# Patient Record
Sex: Female | Born: 1965 | Race: White | Hispanic: No | Marital: Married | Smoking: Never smoker
Health system: Southern US, Community
[De-identification: ages and names within clinical notes are randomized; demographics above are authoritative.]

## PROBLEM LIST (undated history)

## (undated) DIAGNOSIS — K219 Gastro-esophageal reflux disease without esophagitis: Secondary | ICD-10-CM

## (undated) DIAGNOSIS — M797 Fibromyalgia: Secondary | ICD-10-CM

## (undated) DIAGNOSIS — Z972 Presence of dental prosthetic device (complete) (partial): Secondary | ICD-10-CM

## (undated) DIAGNOSIS — J45909 Unspecified asthma, uncomplicated: Secondary | ICD-10-CM

## (undated) DIAGNOSIS — R42 Dizziness and giddiness: Secondary | ICD-10-CM

## (undated) DIAGNOSIS — M1990 Unspecified osteoarthritis, unspecified site: Secondary | ICD-10-CM

## (undated) DIAGNOSIS — E785 Hyperlipidemia, unspecified: Secondary | ICD-10-CM

## (undated) DIAGNOSIS — I10 Essential (primary) hypertension: Secondary | ICD-10-CM

## (undated) DIAGNOSIS — E119 Type 2 diabetes mellitus without complications: Secondary | ICD-10-CM

## (undated) HISTORY — PX: BREAST BIOPSY: SHX20

## (undated) HISTORY — PX: ABDOMINAL HYSTERECTOMY: SHX81

## (undated) HISTORY — PX: NASAL SINUS SURGERY: SHX719

## (undated) HISTORY — PX: THYROID CYST EXCISION: SHX2511

## (undated) HISTORY — PX: GALLBLADDER SURGERY: SHX652

## (undated) HISTORY — PX: PARATHYROID EXPLORATION: SHX732

---

## 2018-02-05 ENCOUNTER — Ambulatory Visit
Admission: EM | Admit: 2018-02-05 | Discharge: 2018-02-05 | Disposition: A | Discharge status: Home / Self Care | Payer: Managed Care, Other (non HMO) | Attending: Emergency Medicine | Admitting: Emergency Medicine

## 2018-02-05 ENCOUNTER — Other Ambulatory Visit: Payer: Self-pay

## 2018-02-05 ENCOUNTER — Encounter: Payer: Self-pay | Admitting: Emergency Medicine

## 2018-02-05 DIAGNOSIS — H6501 Acute serous otitis media, right ear: Secondary | ICD-10-CM | POA: Diagnosis not present

## 2018-02-05 HISTORY — DX: Essential (primary) hypertension: I10

## 2018-02-05 HISTORY — DX: Unspecified asthma, uncomplicated: J45.909

## 2018-02-05 HISTORY — DX: Unspecified osteoarthritis, unspecified site: M19.90

## 2018-02-05 HISTORY — DX: Type 2 diabetes mellitus without complications: E11.9

## 2018-02-05 HISTORY — DX: Hyperlipidemia, unspecified: E78.5

## 2018-02-05 MED ORDER — AMOXICILLIN-POT CLAVULANATE 875-125 MG PO TABS: 1.0000 | ORAL_TABLET | Freq: Two times a day (BID) | ORAL | 0 refills | Status: AC

## 2018-02-05 MED ORDER — IBUPROFEN 600 MG PO TABS: 600.0000 mg | ORAL_TABLET | Freq: Four times a day (QID) | ORAL | 0 refills | Status: DC | PRN

## 2018-02-05 NOTE — ED Triage Notes (Signed)
Cough, congested, right ear pain and facial pressure since yesterday

## 2018-02-05 NOTE — ED Provider Notes (Signed)
HPI  SUBJECTIVE:  Carolyn Holden is a 52 y.o. female who presents with decreased hearing, right ear pain and fullness starting today.  Also reports right maxillary sinus pain.  Thinks that this is radiating from her ear.  She reports nasal congestion, rhinorrhea, postnasal drip, sneezing, itchy, watery eyes, cough at night secondary to the postnasal drip for the past several weeks.  She also reports intermittent positional vertigo.  She reports some tinnitus this morning but states that this is since resolved.  No chest pain, wheezing, shortness of breath.  She has been taking Claritin, Benadryl, Flonase, ibuprofen 400 mg.  Symptoms are better with an ice pack, and the ibuprofen.  Her vertigo is worse with certain positions, particularly leaning forward, turning over with lying down and better when she stops the offending movement.  She states that she gets ear infections once or twice a year when her allergies get bad and she reports that she gets vertigo with these ear infections.  States this vertigo is identical to previous ear infections.  She states meclizine does not work for her.  She has a history of hypertension, asthma, diabetes, hypercholesterolemia, psoriatic arthritis on Enbrel.  LMP: 2009.  She is status post hysterectomy.  GYF:[id number], Jefm Bryant   Past Medical History:  Diagnosis Date  . Arthritis   . Asthma   . Diabetes mellitus without complication ([place])   . Hyperlipidemia   . Hypertension     Past Surgical History:  Procedure Laterality Date  . ABDOMINAL HYSTERECTOMY    . GALLBLADDER SURGERY    . NASAL SINUS SURGERY    . PARATHYROID EXPLORATION    . THYROID CYST EXCISION      No family history on file.  Social History   Tobacco Use  . Smoking status: Never Smoker  . Smokeless tobacco: Never Used  Substance Use Topics  . Alcohol use: Never    Frequency: Never  . Drug use: Never    No current facility-administered medications for this encounter.   Current  Outpatient Medications:  .  albuterol (PROVENTIL HFA) 108 (90 Base) MCG/ACT inhaler, Inhale into the lungs., Disp: , Rfl:  .  atorvastatin (LIPITOR) 10 MG tablet, Take by mouth., Disp: , Rfl:  .  Blood Glucose Monitoring Suppl (ONETOUCH VERIO) w/Device KIT, 1 each by XX route as directed, Disp: , Rfl:  .  calcipotriene-betamethasone (TACLONEX) external suspension, One application daily for up to 8 weeks., Disp: , Rfl:  .  clonazePAM (KLONOPIN) 0.5 MG tablet, Take 0.5 mg by mouth daily., Disp: , Rfl: 5 .  diphenhydrAMINE (BENADRYL) 25 mg capsule, Take by mouth., Disp: , Rfl:  .  enalapril (VASOTEC) 2.5 MG tablet, Take by mouth., Disp: , Rfl:  .  ENBREL SURECLICK 50 MG/ML injection, , Disp: , Rfl:  .  estradiol (MINIVELLE) 0.0375 MG/24HR, Place onto the skin., Disp: , Rfl:  .  fluticasone (VERAMYST) 27.5 MCG/SPRAY nasal spray, Place into the nose., Disp: , Rfl:  .  glucose blood (PRECISION QID TEST) test strip, Use once daily Rotate testing throughout the day-pre meal, 2 hour after meal, bedtime., Disp: , Rfl:  .  loratadine (CLARITIN) 10 MG tablet, Take 10 mg by mouth daily., Disp: , Rfl: 4 .  metFORMIN (GLUCOPHAGE-XR) 500 MG 24 hr tablet, Take by mouth., Disp: , Rfl:  .  zolpidem (AMBIEN) 10 MG tablet, Take by mouth., Disp: , Rfl:  .  amoxicillin-clavulanate (AUGMENTIN) 875-125 MG tablet, Take 1 tablet by mouth 2 (two) times daily for 7  days., Disp: 14 tablet, Rfl: 0 .  ibuprofen (ADVIL,MOTRIN) 600 MG tablet, Take 1 tablet (600 mg total) by mouth every 6 (six) hours as needed., Disp: 30 tablet, Rfl: 0  Not on File   ROS  As noted in HPI.   Physical Exam  BP (!) 145/90 (BP Location: Right Arm)   Pulse 74   Temp 98.3 F (36.8 C) (Oral)   Resp 16   Ht 5' 4.5" (1.638 m)   Wt 210 lb (95.3 kg)   SpO2 98%   BMI 35.49 kg/m   Constitutional: Well developed, well nourished, no acute distress Eyes:  EOMI, conjunctiva normal bilaterally HENT: Normocephalic, atraumatic,mucus membranes  moist.  Right ear: External ear normal.  External ear canal normal.  No pain with traction on pinna or palpation of tragus.  No tenderness over the mastoid.  Right TM dull, with fluid behind it.  No erythema or bulging.  Left TM normal.  No tenderness or crepitus over the TMJ bilaterally.  Positive purulent nasal congestion.  Positive right maxillary sinus tenderness.  Positive cobblestoning, no postnasal drip. Neck: No cervical lymphadenopathy Respiratory: Normal inspiratory effort, lungs clear bilaterally Cardiovascular: Normal rate GI: nondistended skin: No rash, skin intact Musculoskeletal: no deformities Neurologic: Alert & oriented x 3, no focal neuro deficits Psychiatric: Speech and behavior appropriate   ED Course   Medications - No data to display  No orders of the defined types were placed in this encounter.   No results found for this or any previous visit (from the past 24 hour(s)). No results found.  ED Clinical Impression  Right acute serous otitis media, recurrence not specified   ED Assessment/Plan  Patient with a serous otitis.  She states that the vertigo that she has experienced for the past several weeks is identical to whenever she gets ear infections which happen once or twice a year.  She denies any strokelike symptoms.  Has no neuro deficits.  Doubt Mnire's.  She also has some sinus tenderness which is concerning for sinusitis.  Will send home with Augmentin for 7 days.  Also ibuprofen 600 mg to take with 1 g of Tylenol 3-4 times a day as needed for pain.  Follow-up with her primary care physician as needed.   Discussed MDM, treatment plan, and plan for follow-up with patient. Discussed sn/sx that should prompt return to the ED. patient agrees with plan.   Meds ordered this encounter  Medications  . amoxicillin-clavulanate (AUGMENTIN) 875-125 MG tablet    Sig: Take 1 tablet by mouth 2 (two) times daily for 7 days.    Dispense:  14 tablet    Refill:  0   . ibuprofen (ADVIL,MOTRIN) 600 MG tablet    Sig: Take 1 tablet (600 mg total) by mouth every 6 (six) hours as needed.    Dispense:  30 tablet    Refill:  0    *This clinic note was created using [employer]. Therefore, there may be occasional mistakes despite careful proofreading.   ?   Melynda Ripple, MD 02/05/18 1642

## 2018-02-05 NOTE — Discharge Instructions (Addendum)
Continue the Benadryl, Claritin, Flonase.  600 mg of ibuprofen with 1 g of Tylenol 3 or 4 times a day as we discussed.  Try some saline nasal irrigation with a Lloyd HugerNeil med rinse and distilled water.  The antibiotics, even if you feel better.

## 2019-09-07 ENCOUNTER — Emergency Department
Admission: EM | Admit: 2019-09-07 | Discharge: 2019-09-07 | Disposition: A | Discharge status: Home / Self Care | Payer: Managed Care, Other (non HMO) | Attending: Emergency Medicine | Admitting: Emergency Medicine

## 2019-09-07 ENCOUNTER — Emergency Department: Payer: Managed Care, Other (non HMO)

## 2019-09-07 ENCOUNTER — Encounter: Payer: Self-pay | Admitting: Medical Oncology

## 2019-09-07 ENCOUNTER — Other Ambulatory Visit: Payer: Self-pay

## 2019-09-07 DIAGNOSIS — E119 Type 2 diabetes mellitus without complications: Secondary | ICD-10-CM | POA: Diagnosis not present

## 2019-09-07 DIAGNOSIS — Z7984 Long term (current) use of oral hypoglycemic drugs: Secondary | ICD-10-CM | POA: Diagnosis not present

## 2019-09-07 DIAGNOSIS — R1012 Left upper quadrant pain: Secondary | ICD-10-CM

## 2019-09-07 DIAGNOSIS — I10 Essential (primary) hypertension: Secondary | ICD-10-CM | POA: Insufficient documentation

## 2019-09-07 DIAGNOSIS — Z79899 Other long term (current) drug therapy: Secondary | ICD-10-CM | POA: Insufficient documentation

## 2019-09-07 DIAGNOSIS — K219 Gastro-esophageal reflux disease without esophagitis: Secondary | ICD-10-CM | POA: Insufficient documentation

## 2019-09-07 DIAGNOSIS — J45909 Unspecified asthma, uncomplicated: Secondary | ICD-10-CM | POA: Diagnosis not present

## 2019-09-07 LAB — BASIC METABOLIC PANEL
Anion gap: 10 (ref 5–15)
BUN: 16 mg/dL (ref 6–20)
CO2: 25 mmol/L (ref 22–32)
Calcium: 9.6 mg/dL (ref 8.9–10.3)
Chloride: 105 mmol/L (ref 98–111)
Creatinine, Ser: 1.03 mg/dL — ABNORMAL HIGH (ref 0.44–1.00)
GFR calc Af Amer: >60 mL/min (ref >60)
GFR calc non Af Amer: >60 mL/min (ref >60)
Glucose, Bld: 150 mg/dL — ABNORMAL HIGH (ref 70–99)
Potassium: 3.5 mmol/L (ref 3.5–5.1)
Sodium: 140 mmol/L (ref 135–145)

## 2019-09-07 LAB — CBC
HCT: 45.6 % (ref 36.0–46.0)
Hemoglobin: 15.4 g/dL — ABNORMAL HIGH (ref 12.0–15.0)
MCH: 30 pg (ref 26.0–34.0)
MCHC: 33.8 g/dL (ref 30.0–36.0)
MCV: 88.9 fL (ref 80.0–100.0)
Platelets: 256 10*3/uL (ref 150–400)
RBC: 5.13 MIL/uL — ABNORMAL HIGH (ref 3.87–5.11)
RDW: 12.7 % (ref 11.5–15.5)
WBC: 7.4 10*3/uL (ref 4.0–10.5)
nRBC: 0 % (ref 0.0–0.2)

## 2019-09-07 LAB — TROPONIN I (HIGH SENSITIVITY)
Troponin I (High Sensitivity): <2 ng/L (ref <18)
Troponin I (High Sensitivity): <2 ng/L (ref <18)

## 2019-09-07 MED ORDER — ALUMINUM-MAGNESIUM-SIMETHICONE 200-200-20 MG/5ML PO SUSP: 30.0000 mL | Freq: Three times a day (TID) | ORAL | 0 refills | Status: AC

## 2019-09-07 MED ORDER — FAMOTIDINE 20 MG PO TABS: 20.0000 mg | ORAL_TABLET | Freq: Two times a day (BID) | ORAL | 0 refills | Status: AC

## 2019-09-07 MED ORDER — SODIUM CHLORIDE 0.9% FLUSH: 3.0000 mL | Freq: Once | INTRAVENOUS | Status: DC

## 2019-09-07 NOTE — ED Triage Notes (Signed)
Pt reports she began yesterday having jaw pain, pain went away, this am pain began again and was substernal and down her arm. Pt denies sob.

## 2019-09-07 NOTE — ED Provider Notes (Signed)
[facility] Emergency Department Provider Note  ____________________________________________  Time seen: Approximately 11:50 AM  I have reviewed the triage vital signs and the nursing notes.   HISTORY  Chief Complaint Chest Pain, Arm Pain, and Jaw Pain    HPI Carolyn Holden is a 54 y.o. female with a history of diabetes hypertension hyperlipidemia who comes the ED complaining of left upper quadrant abdominal pain that was also referred to her left posterior shoulder.  Denies chest pain or shortness of breath.  No vomiting or diaphoresis.  Was present when she woke up this morning.  Feels like heartburn that she has had in the past but was more pronounced than usual.  She has taken omeprazole in the past but usually does not take it and instead just uses Alka-Seltzer or Tums as needed.  Symptoms are currently resolved as they are alleviated sitting upright.      Past Medical History:  Diagnosis Date  . Arthritis   . Asthma   . Diabetes mellitus without complication ([place])   . Hyperlipidemia   . Hypertension      There are no problems to display for this patient.    Past Surgical History:  Procedure Laterality Date  . ABDOMINAL HYSTERECTOMY    . GALLBLADDER SURGERY    . NASAL SINUS SURGERY    . PARATHYROID EXPLORATION    . THYROID CYST EXCISION       Prior to Admission medications   Medication Sig Start Date End Date Taking? Authorizing Provider  albuterol (PROVENTIL HFA) 108 (90 Base) MCG/ACT inhaler Inhale into the lungs. 02/16/17   [provider]  aluminum-magnesium hydroxide-simethicone (MAALOX) 329-924-26 MG/5ML SUSP Take 30 mLs by mouth 4 (four) times daily -  before meals and at bedtime. 09/07/19   Carrie Mew, MD  atorvastatin (LIPITOR) 10 MG tablet Take by mouth. 01/18/18 04/18/18  [provider]  Blood Glucose Monitoring Suppl (ONETOUCH VERIO) w/Device KIT 1 each by XX route as directed 01/15/18   [provider]  calcipotriene-betamethasone [facility]) external suspension One application daily for up to 8 weeks. 05/04/15   [provider]  clonazePAM (KLONOPIN) 0.5 MG tablet Take 0.5 mg by mouth daily. 01/19/18   [provider]  diphenhydrAMINE (BENADRYL) 25 mg capsule Take by mouth. 01/18/18   [provider]  enalapril (VASOTEC) 2.5 MG tablet Take by mouth. 02/16/17   [provider]  ENBREL SURECLICK 50 MG/ML injection  01/18/18   [provider]  estradiol (MINIVELLE) 0.0375 MG/24HR Place onto the skin. 10/15/17   [provider]  famotidine (PEPCID) 20 MG tablet Take 1 tablet (20 mg total) by mouth 2 (two) times daily. 09/07/19   Carrie Mew, MD  fluticasone (VERAMYST) 27.5 MCG/SPRAY nasal spray Place into the nose. 01/18/18 01/18/19  [provider]  ibuprofen (ADVIL,MOTRIN) 600 MG tablet Take 1 tablet (600 mg total) by mouth every 6 (six) hours as needed. 02/05/18   Melynda Ripple, MD  loratadine (CLARITIN) 10 MG tablet Take 10 mg by mouth daily. 01/18/18   [provider]  metFORMIN (GLUCOPHAGE-XR) 500 MG 24 hr tablet Take by mouth. 01/18/18   [provider]  zolpidem (AMBIEN) 10 MG tablet Take by mouth. 09/28/17   [provider]     Allergies Dilaudid [hydromorphone] and Levaquin [levofloxacin]   No family history on file.  Social History Social History   Tobacco Use  . Smoking status: Never Smoker  . Smokeless tobacco: Never Used  Substance Use  Topics  . Alcohol use: Never  . Drug use: Never    Review of Systems  Constitutional:   No fever or chills.  ENT:   No sore throat. No rhinorrhea. Cardiovascular:   No chest pain or syncope. Respiratory:   No dyspnea or cough. Gastrointestinal:   Positive as above for upper abdominal pain.  No vomiting or diarrhea. Musculoskeletal:   Negative for focal pain or swelling All other systems reviewed and are negative except as documented above  in ROS and HPI.  ____________________________________________   PHYSICAL EXAM:  VITAL SIGNS: ED Triage Vitals  Enc Vitals Group     BP 09/07/19 0720 130/90     Pulse Rate 09/07/19 0720 85     Resp 09/07/19 0720 18     Temp 09/07/19 0720 98.6 F (37 C)     Temp Source 09/07/19 0720 Oral     SpO2 09/07/19 0720 97 %     Weight 09/07/19 0713 200 lb (90.7 kg)     Height 09/07/19 0713 5' 4" (1.626 m)     Head Circumference --      Peak Flow --      Pain Score 09/07/19 0712 8     Pain Loc --      Pain Edu? --      Excl. in [place]? --     Vital signs reviewed, nursing assessments reviewed.   Constitutional:   Alert and oriented. Non-toxic appearance. Eyes:   Conjunctivae are normal. EOMI. PERRL. ENT      Head:   Normocephalic and atraumatic.      Nose:   Wearing a mask.      Mouth/Throat:   Wearing a mask.      Neck:   No meningismus. Full ROM. Hematological/Lymphatic/Immunilogical:   No cervical lymphadenopathy. Cardiovascular:   RRR. Symmetric bilateral radial and DP pulses.  No murmurs. Cap refill less than 2 seconds. Respiratory:   Normal respiratory effort without tachypnea/retractions. Breath sounds are clear and equal bilaterally. No wheezes/rales/rhonchi. Gastrointestinal:   Soft and nontender. Non distended. There is no CVA tenderness.  No rebound, rigidity, or guarding. Musculoskeletal:   Normal range of motion in all extremities. No joint effusions.  No lower extremity tenderness.  No edema. Neurologic:   Normal speech and language.  Motor grossly intact. No acute focal neurologic deficits are appreciated.  Skin:    Skin is warm, dry and intact. No rash noted.  No petechiae, purpura, or bullae.  ____________________________________________    LABS (pertinent positives/negatives) (all labs ordered are listed, but only abnormal results are displayed) Labs Reviewed  BASIC METABOLIC PANEL - Abnormal; Notable for the following components:      Result Value   Glucose,  Bld 150 (*)    Creatinine, Ser 1.03 (*)    All other components within normal limits  CBC - Abnormal; Notable for the following components:   RBC 5.13 (*)    Hemoglobin 15.4 (*)    All other components within normal limits  TROPONIN I (HIGH SENSITIVITY)  TROPONIN I (HIGH SENSITIVITY)   ____________________________________________   EKG  Interpreted by me Normal sinus rhythm rate of 90.  Normal axis and intervals.  Poor R wave progression.  Normal ST segments and T waves.  No acute ischemic changes.  ____________________________________________    RADIOLOGY  DG Chest 2 View  Result Date: 09/07/2019 CLINICAL DATA:  Substernal chest pain. EXAM: CHEST - 2 VIEW COMPARISON:  None. FINDINGS: The heart size and mediastinal contours are  within normal limits. Both lungs are clear. The visualized skeletal structures are unremarkable. IMPRESSION: No active cardiopulmonary disease. Electronically Signed   By: Titus Dubin M.D.   On: 09/07/2019 08:10    ____________________________________________   PROCEDURES Procedures  ____________________________________________    CLINICAL IMPRESSION / ASSESSMENT AND PLAN / ED COURSE  Medications ordered in the ED: Medications  sodium chloride flush (NS) 0.9 % injection 3 mL (has no administration in time range)    Pertinent labs & imaging results that were available during my care of the patient were reviewed by me and considered in my medical decision making (see chart for details).  Carolyn Holden was evaluated in Emergency Department on 09/07/2019 for the symptoms described in the history of present illness. She was evaluated in the context of the global COVID-19 pandemic, which necessitated consideration that the patient might be at risk for infection with the SARS-CoV-2 virus that causes COVID-19. Institutional protocols and algorithms that pertain to the evaluation of patients at risk for COVID-19 are in a state of rapid change based  on information released by regulatory bodies including the CDC and federal and state organizations. These policies and algorithms were followed during the patient's care in the ED.   Patient presents with upper abdominal pain consistent with GERD.Considering the patient's symptoms, medical history, and physical examination today, I have low suspicion for ACS, PE, TAD, pneumothorax, carditis, mediastinitis, pneumonia, CHF, or sepsis.  I doubt pancreatitis, bowel obstruction, AAA, dissection, biliary disease.  I will treat her with antacids.  EKG chest x-ray and high-sensitivity troponin are all normal which in this case does not require repeating.      ____________________________________________   FINAL CLINICAL IMPRESSION(S) / ED DIAGNOSES    Final diagnoses:  LUQ pain  Gastroesophageal reflux disease, unspecified whether esophagitis present     ED Discharge Orders         Ordered    aluminum-magnesium hydroxide-simethicone (MAALOX) 200-200-20 MG/5ML SUSP  3 times daily before meals & bedtime     09/07/19 1142    famotidine (PEPCID) 20 MG tablet  2 times daily     09/07/19 1142          Portions of this note were generated with dragon dictation software. Dictation errors may occur despite best attempts at proofreading.   Carrie Mew, MD 09/07/19 1153

## 2019-09-09 ENCOUNTER — Other Ambulatory Visit: Payer: Self-pay | Admitting: Gerontology

## 2019-09-09 DIAGNOSIS — R1013 Epigastric pain: Secondary | ICD-10-CM

## 2019-09-09 DIAGNOSIS — M797 Fibromyalgia: Secondary | ICD-10-CM

## 2019-09-29 ENCOUNTER — Ambulatory Visit: Payer: Managed Care, Other (non HMO)

## 2019-10-04 ENCOUNTER — Ambulatory Visit
Admission: RE | Admit: 2019-10-04 | Discharge: 2019-10-04 | Disposition: A | Discharge status: Home / Self Care | Payer: Managed Care, Other (non HMO) | Source: Ambulatory Visit | Attending: Gerontology | Admitting: Gerontology

## 2019-10-04 ENCOUNTER — Other Ambulatory Visit: Payer: Self-pay

## 2019-10-04 DIAGNOSIS — M797 Fibromyalgia: Secondary | ICD-10-CM | POA: Insufficient documentation

## 2019-10-04 DIAGNOSIS — R1013 Epigastric pain: Secondary | ICD-10-CM

## 2019-10-10 ENCOUNTER — Other Ambulatory Visit: Payer: Self-pay | Admitting: Gastroenterology

## 2019-10-10 DIAGNOSIS — R1012 Left upper quadrant pain: Secondary | ICD-10-CM

## 2019-10-10 DIAGNOSIS — R1013 Epigastric pain: Secondary | ICD-10-CM

## 2019-10-14 ENCOUNTER — Ambulatory Visit: Admission: RE | Admit: 2019-10-14 | Payer: Managed Care, Other (non HMO) | Source: Ambulatory Visit

## 2019-10-17 ENCOUNTER — Other Ambulatory Visit: Payer: Self-pay | Admitting: Obstetrics and Gynecology

## 2019-10-17 DIAGNOSIS — Z1231 Encounter for screening mammogram for malignant neoplasm of breast: Secondary | ICD-10-CM

## 2019-10-19 ENCOUNTER — Ambulatory Visit
Admission: RE | Admit: 2019-10-19 | Discharge: 2019-10-19 | Disposition: A | Discharge status: Home / Self Care | Payer: Managed Care, Other (non HMO) | Source: Ambulatory Visit | Attending: Gastroenterology | Admitting: Gastroenterology

## 2019-10-19 ENCOUNTER — Other Ambulatory Visit: Payer: Self-pay

## 2019-10-19 DIAGNOSIS — R1012 Left upper quadrant pain: Secondary | ICD-10-CM | POA: Diagnosis not present

## 2019-10-19 DIAGNOSIS — R1013 Epigastric pain: Secondary | ICD-10-CM | POA: Diagnosis present

## 2019-10-19 MED ORDER — IOHEXOL 300 MG/ML  SOLN
100.0000 mL | Freq: Once | INTRAMUSCULAR | Status: AC | PRN
  Administered 2019-10-19: 100 mL via INTRAVENOUS

## 2019-10-20 ENCOUNTER — Inpatient Hospital Stay: Admission: RE | Admit: 2019-10-20 | Payer: Managed Care, Other (non HMO) | Source: Ambulatory Visit

## 2019-10-24 ENCOUNTER — Ambulatory Visit: Payer: Managed Care, Other (non HMO)

## 2019-10-25 ENCOUNTER — Ambulatory Visit: Payer: Managed Care, Other (non HMO)

## 2019-11-15 ENCOUNTER — Inpatient Hospital Stay: Admission: RE | Admit: 2019-11-15 | Payer: Managed Care, Other (non HMO) | Source: Ambulatory Visit

## 2019-11-16 ENCOUNTER — Inpatient Hospital Stay: Admission: RE | Admit: 2019-11-16 | Payer: Managed Care, Other (non HMO) | Source: Ambulatory Visit

## 2019-11-28 ENCOUNTER — Inpatient Hospital Stay: Admission: RE | Admit: 2019-11-28 | Payer: Managed Care, Other (non HMO) | Source: Ambulatory Visit

## 2019-12-05 ENCOUNTER — Ambulatory Visit
Admission: RE | Admit: 2019-12-05 | Discharge: 2019-12-05 | Disposition: A | Discharge status: Home / Self Care | Payer: Managed Care, Other (non HMO) | Source: Ambulatory Visit | Attending: Obstetrics and Gynecology | Admitting: Obstetrics and Gynecology

## 2019-12-05 ENCOUNTER — Other Ambulatory Visit: Payer: Self-pay

## 2019-12-05 DIAGNOSIS — Z1231 Encounter for screening mammogram for malignant neoplasm of breast: Secondary | ICD-10-CM | POA: Insufficient documentation

## 2019-12-07 ENCOUNTER — Other Ambulatory Visit: Payer: Self-pay | Admitting: *Deleted

## 2019-12-07 ENCOUNTER — Inpatient Hospital Stay
Admission: RE | Admit: 2019-12-07 | Discharge: 2019-12-07 | Disposition: A | Discharge status: Home / Self Care | Payer: Self-pay | Source: Ambulatory Visit | Attending: *Deleted | Admitting: *Deleted

## 2019-12-07 DIAGNOSIS — Z1231 Encounter for screening mammogram for malignant neoplasm of breast: Secondary | ICD-10-CM

## 2020-03-02 ENCOUNTER — Other Ambulatory Visit: Payer: Self-pay | Admitting: Nurse Practitioner

## 2020-03-02 ENCOUNTER — Other Ambulatory Visit (HOSPITAL_COMMUNITY): Payer: Self-pay | Admitting: Nurse Practitioner

## 2020-03-02 DIAGNOSIS — R1312 Dysphagia, oropharyngeal phase: Secondary | ICD-10-CM

## 2020-03-07 ENCOUNTER — Ambulatory Visit: Payer: Managed Care, Other (non HMO)

## 2020-03-13 ENCOUNTER — Ambulatory Visit: Admission: RE | Admit: 2020-03-13 | Payer: Managed Care, Other (non HMO) | Source: Ambulatory Visit

## 2020-04-09 ENCOUNTER — Ambulatory Visit
Admission: RE | Admit: 2020-04-09 | Discharge: 2020-04-09 | Disposition: A | Discharge status: Home / Self Care | Payer: Managed Care, Other (non HMO) | Source: Ambulatory Visit | Attending: Nurse Practitioner | Admitting: Nurse Practitioner

## 2020-04-09 ENCOUNTER — Other Ambulatory Visit: Payer: Self-pay

## 2020-04-09 DIAGNOSIS — R1312 Dysphagia, oropharyngeal phase: Secondary | ICD-10-CM

## 2020-05-16 ENCOUNTER — Other Ambulatory Visit: Payer: Self-pay | Admitting: Gastroenterology

## 2020-05-22 ENCOUNTER — Other Ambulatory Visit: Payer: Self-pay | Admitting: Gastroenterology

## 2020-05-22 DIAGNOSIS — R1312 Dysphagia, oropharyngeal phase: Secondary | ICD-10-CM

## 2020-06-04 ENCOUNTER — Ambulatory Visit: Admission: RE | Admit: 2020-06-04 | Payer: Managed Care, Other (non HMO) | Source: Ambulatory Visit

## 2020-06-12 ENCOUNTER — Ambulatory Visit: Admission: RE | Admit: 2020-06-12 | Payer: Managed Care, Other (non HMO) | Source: Ambulatory Visit

## 2020-07-01 ENCOUNTER — Ambulatory Visit: Payer: Self-pay

## 2020-07-10 ENCOUNTER — Ambulatory Visit
Admission: RE | Admit: 2020-07-10 | Discharge: 2020-07-10 | Disposition: A | Discharge status: Home / Self Care | Payer: Managed Care, Other (non HMO) | Source: Ambulatory Visit | Attending: Family Medicine | Admitting: Family Medicine

## 2020-07-10 ENCOUNTER — Other Ambulatory Visit: Payer: Self-pay

## 2020-07-10 VITALS — BP 135/115 | HR 91 | Temp 98.3°F | Resp 18 | Ht 64.0 in | Wt 200.0 lb

## 2020-07-10 DIAGNOSIS — J029 Acute pharyngitis, unspecified: Secondary | ICD-10-CM

## 2020-07-10 DIAGNOSIS — J039 Acute tonsillitis, unspecified: Secondary | ICD-10-CM | POA: Insufficient documentation

## 2020-07-10 DIAGNOSIS — J45909 Unspecified asthma, uncomplicated: Secondary | ICD-10-CM | POA: Diagnosis not present

## 2020-07-10 DIAGNOSIS — Z79899 Other long term (current) drug therapy: Secondary | ICD-10-CM | POA: Insufficient documentation

## 2020-07-10 DIAGNOSIS — Z20822 Contact with and (suspected) exposure to covid-19: Secondary | ICD-10-CM | POA: Insufficient documentation

## 2020-07-10 DIAGNOSIS — R0981 Nasal congestion: Secondary | ICD-10-CM | POA: Insufficient documentation

## 2020-07-10 DIAGNOSIS — Z7951 Long term (current) use of inhaled steroids: Secondary | ICD-10-CM | POA: Diagnosis not present

## 2020-07-10 LAB — RESP PANEL BY RT-PCR (FLU A&B, COVID) ARPGX2
Influenza A by PCR: NEGATIVE
Influenza B by PCR: NEGATIVE
SARS Coronavirus 2 by RT PCR: NEGATIVE

## 2020-07-10 LAB — GROUP A STREP BY PCR: Group A Strep by PCR: NOT DETECTED

## 2020-07-10 MED ORDER — CEFDINIR 300 MG PO CAPS: 300.0000 mg | ORAL_CAPSULE | Freq: Two times a day (BID) | ORAL | 0 refills | Status: AC

## 2020-07-10 NOTE — ED Provider Notes (Signed)
[facility]    CSN: [id number] Arrival date & time: 07/10/20  1645      History   Chief Complaint Chief Complaint  Patient presents with  . Appointment  . Otalgia  . Sore Throat    HPI Carolyn Holden is a 54 y.o. female presenting for 3-day history of sore throat, swollen tonsils with white patches, nasal congestion sinus pain, dry cough and left ear pain/pressure.  Patient states that a month ago she had similar symptoms and was treated with 5 days of Augmentin.  Says she could not tolerate that so she was changed to regular amoxicillin for 5 more days.  Patient also states she took prednisone that time.  Patient says her symptoms fully resolved with those medications.  Symptoms returned a couple days ago.  Patient denies any associated fever.  She does admit to fatigue.  She denies any chest pain or breathing difficulty.  No wheezing.  Patient's past medical history significant for asthma, diabetes, hypertension and hyperlipidemia.  Patient states she has really bad allergies and takes antihistamines and Flonase.  Patient also has a daily asthma inhaler and uses albuterol as needed.  Patient denies any recent increase use of inhaler.  She denies any known Covid exposure.  Patient fully vaccinated for Covid.  No other complaints or concerns.  HPI  Past Medical History:  Diagnosis Date  . Arthritis   . Asthma   . Diabetes mellitus without complication ([place])   . Hyperlipidemia   . Hypertension     There are no problems to display for this patient.   Past Surgical History:  Procedure Laterality Date  . ABDOMINAL HYSTERECTOMY    . BREAST BIOPSY Right    neg  . GALLBLADDER SURGERY    . NASAL SINUS SURGERY    . PARATHYROID EXPLORATION    . THYROID CYST EXCISION      OB History   No obstetric history on file.      Home Medications    Prior to Admission medications   Medication Sig Start Date End Date Taking? Authorizing Provider  albuterol (PROVENTIL  HFA) 108 (90 Base) MCG/ACT inhaler Inhale into the lungs. 02/16/17  Yes [provider]  calcipotriene-betamethasone (TACLONEX) external suspension One application daily for up to 8 weeks. 05/04/15  Yes [provider]  clonazePAM (KLONOPIN) 0.5 MG tablet Take 0.5 mg by mouth daily. 01/19/18  Yes [provider]  diphenhydrAMINE (BENADRYL) 25 mg capsule Take by mouth. 01/18/18  Yes [provider]  enalapril (VASOTEC) 2.5 MG tablet Take by mouth. 02/16/17  Yes [provider]  estradiol (MINIVELLE) 0.0375 MG/24HR Place onto the skin. 10/15/17  Yes [provider]  ibuprofen (ADVIL,MOTRIN) 600 MG tablet Take 1 tablet (600 mg total) by mouth every 6 (six) hours as needed. 02/05/18  Yes Melynda Ripple, MD  metFORMIN (GLUCOPHAGE-XR) 500 MG 24 hr tablet Take by mouth. 01/18/18  Yes [provider]  TALTZ 80 MG/ML SOAJ Inject into the skin. 03/06/20  Yes [provider]  aluminum-magnesium hydroxide-simethicone (MAALOX) 846-659-93 MG/5ML SUSP Take 30 mLs by mouth 4 (four) times daily -  before meals and at bedtime. 09/07/19   Carrie Mew, MD  atorvastatin (LIPITOR) 10 MG tablet Take by mouth. 01/18/18 04/18/18  [provider]  Blood Glucose Monitoring Suppl (ONETOUCH VERIO) w/Device KIT 1 each by XX route as directed 01/15/18   [provider]  cefdinir (OMNICEF) 300 MG capsule Take 1 capsule (300 mg total) by mouth 2 (  two) times daily for 10 days. 07/10/20 07/20/20  Danton Clap, PA-C  ENBREL SURECLICK 50 MG/ML injection  01/18/18   [provider]  famotidine (PEPCID) 20 MG tablet Take 1 tablet (20 mg total) by mouth 2 (two) times daily. 09/07/19   Carrie Mew, MD  fluticasone (VERAMYST) 27.5 MCG/SPRAY nasal spray Place into the nose. 01/18/18 01/18/19  [provider]  Fluticasone-Salmeterol (ADVAIR DISKUS) 250-50 MCG/DOSE AEPB Advair Diskus 250 mcg-50 mcg/dose powder for inhalation  Inhale 2 puffs every  day by inhalation route as needed for 30 days.    [provider]  loratadine (CLARITIN) 10 MG tablet Take 10 mg by mouth daily. 01/18/18   [provider]  zolpidem (AMBIEN) 10 MG tablet Take by mouth. 09/28/17   [provider]    Family History Family History  Problem Relation Age of Onset  . Breast cancer Paternal Aunt     Social History Social History   Tobacco Use  . Smoking status: Never Smoker  . Smokeless tobacco: Never Used  Vaping Use  . Vaping Use: Never used  Substance Use Topics  . Alcohol use: Never  . Drug use: Never     Allergies   Dilaudid [hydromorphone] and Levaquin [levofloxacin]   Review of Systems Review of Systems  Constitutional: Positive for fatigue. Negative for chills, diaphoresis and fever.  HENT: Positive for congestion, ear pain, rhinorrhea, sinus pressure, sinus pain, sore throat and trouble swallowing.   Respiratory: Positive for cough. Negative for shortness of breath and wheezing.   Cardiovascular: Negative for chest pain.  Gastrointestinal: Negative for abdominal pain, nausea and vomiting.  Musculoskeletal: Negative for arthralgias and myalgias.  Skin: Negative for rash.  Neurological: Positive for dizziness. Negative for weakness and headaches.  Hematological: Positive for adenopathy.     Physical Exam Triage Vital Signs ED Triage Vitals  Enc Vitals Group     BP 07/10/20 1720 (!) 161/85     Pulse Rate 07/10/20 1720 91     Resp 07/10/20 1720 18     Temp 07/10/20 1720 98.3 F (36.8 C)     Temp Source 07/10/20 1720 Oral     SpO2 07/10/20 1720 98 %     Weight 07/10/20 1717 199 lb 15.3 oz (90.7 kg)     Height 07/10/20 1717 _0  (1.626 m)     Head Circumference --      Peak Flow --      Pain Score 07/10/20 1716 3     Pain Loc --      Pain Edu? --      Excl. in [place]? --    No data found.  Updated Vital Signs BP (!) 135/115 (BP Location: Left Arm)   Pulse 91   Temp 98.3 F (36.8 C) (Oral)   Resp  18   Ht _1  (1.626 m)   Wt 199 lb 15.3 oz (90.7 kg)   SpO2 98%   BMI 34.32 kg/m       Physical Exam Vitals and nursing note reviewed.  Constitutional:      General: She is not in acute distress.    Appearance: Normal appearance. She is well-developed. She is not ill-appearing or toxic-appearing.  HENT:     Head: Normocephalic and atraumatic.     Right Ear: Tympanic membrane and ear canal normal.     Left Ear: Ear canal normal. A middle ear effusion is present.     Nose: Congestion and rhinorrhea present.  Mouth/Throat:     Mouth: Mucous membranes are moist.     Pharynx: Oropharynx is clear. Posterior oropharyngeal erythema present.     Tonsils: Tonsillar exudate (whitish exudates right tonsil) present. 2+ on the right. 2+ on the left.  Eyes:     General: No scleral icterus.       Right eye: No discharge.        Left eye: No discharge.     Conjunctiva/sclera: Conjunctivae normal.  Cardiovascular:     Rate and Rhythm: Normal rate and regular rhythm.     Heart sounds: Normal heart sounds.  Pulmonary:     Effort: Pulmonary effort is normal. No respiratory distress.     Breath sounds: Normal breath sounds. No wheezing, rhonchi or rales.  Musculoskeletal:     Cervical back: Neck supple.  Lymphadenopathy:     Cervical: Cervical adenopathy (anterior) present.  Skin:    General: Skin is dry.  Neurological:     General: No focal deficit present.     Mental Status: She is alert. Mental status is at baseline.     Motor: No weakness.     Gait: Gait normal.  Psychiatric:        Mood and Affect: Mood normal.        Behavior: Behavior normal.        Thought Content: Thought content normal.      UC Treatments / Results  Labs (all labs ordered are listed, but only abnormal results are displayed) Labs Reviewed  GROUP A STREP BY PCR  RESP PANEL BY RT-PCR (FLU A&B, COVID) [id number]    EKG   Radiology No results found.  Procedures Procedures (including critical care  time)  Medications Ordered in UC Medications - No data to display  Initial Impression / Assessment and Plan / UC Course  I have reviewed the triage vital signs and the nursing notes.  Pertinent labs & imaging results that were available during my care of the patient were reviewed by me and considered in my medical decision making (see chart for details).   54 year old female presenting for 3 to 4-day history of tonsillitis, sinus pain and pressure along with left ear pain.  On exam she does have exudative tonsillitis and tonsillar hypertrophy.  Chest is clear to auscultation.  Blood pressure is elevated 135/115.  All other vital signs are normal and stable    Molecular strep test negative.  Respiratory panel obtained.  Respiratory panel negative for flu and Covid.  Discussed results with patient. Since she does have exudative tonsillitis and did have improvement with amoxicillin, treating with cefdinir at this time. Advised to continue allergy medications. Supportive care. F/u as needed.  Final Clinical Impressions(s) / UC Diagnoses   Final diagnoses:  Acute tonsillitis, unspecified etiology  Sore throat  Nasal congestion   Discharge Instructions   None    ED Prescriptions    Medication Sig Dispense Auth. Provider   cefdinir (OMNICEF) 300 MG capsule Take 1 capsule (300 mg total) by mouth 2 (two) times daily for 10 days. 20 capsule Danton Clap, PA-C     PDMP not reviewed this encounter.   Danton Clap, PA-C 07/10/20 [id number]

## 2020-07-10 NOTE — ED Triage Notes (Signed)
Pt c/o sore throat, sinus pressure/pain, cough, left ear pain. She states she was sick about a month ago and given antibiotic but she states she only had 5 days because she took one antibiotic but could not handle it. She states her symptoms started aback about 3 days ago. She was tested for covid when she was sick the first time. She has been covid vaccines.

## 2020-07-14 ENCOUNTER — Ambulatory Visit: Payer: Self-pay

## 2020-12-04 ENCOUNTER — Other Ambulatory Visit: Payer: Self-pay | Admitting: Obstetrics and Gynecology

## 2020-12-04 DIAGNOSIS — Z1231 Encounter for screening mammogram for malignant neoplasm of breast: Secondary | ICD-10-CM

## 2020-12-25 ENCOUNTER — Ambulatory Visit
Admission: RE | Admit: 2020-12-25 | Discharge: 2020-12-25 | Disposition: A | Discharge status: Home / Self Care | Payer: Managed Care, Other (non HMO) | Source: Ambulatory Visit | Attending: Obstetrics and Gynecology | Admitting: Obstetrics and Gynecology

## 2020-12-25 ENCOUNTER — Other Ambulatory Visit: Payer: Self-pay

## 2020-12-25 DIAGNOSIS — Z1231 Encounter for screening mammogram for malignant neoplasm of breast: Secondary | ICD-10-CM | POA: Insufficient documentation

## 2021-03-19 ENCOUNTER — Ambulatory Visit: Payer: Self-pay

## 2021-04-19 NOTE — Discharge Instructions (Signed)
T & A INSTRUCTION SHEET - [facility] °[facility] ° °PAUL JUENGEL, MD ° °[address] [place],  [address] [place].  °[phone] ° °INFORMATION SHEET FOR A TONSILLECTOMY AND ADENDOIDECTOMY ° °About Your Tonsils and Adenoids ° The tonsils and adenoids are normal body tissues that are part of our immune system.  They normally help to protect us against diseases that may enter our mouth and nose. However, sometimes the tonsils and/or adenoids become too large and obstruct our breathing, especially at night. °  ° If either of these things happen it helps to remove the tonsils and adenoids in order to become healthier. The operation to remove the tonsils and adenoids is called a tonsillectomy and adenoidectomy. ° °The Location of Your Tonsils and Adenoids ° The tonsils are located in the back of the throat on both side and sit in a cradle of muscles. The adenoids are located in the roof of the mouth, behind the nose, and closely associated with the opening of the Eustachian tube to the ear. ° °Surgery on Tonsils and Adenoids ° A tonsillectomy and adenoidectomy is a short operation which takes about thirty minutes.  This includes being put to sleep and being awakened. Tonsillectomies and adenoidectomies are performed at [facility] and may require observation period in the recovery room prior to going home. Children are required to remain in recovery for at least 45 minutes.  ° °Following the Operation for a Tonsillectomy ° A cautery machine is used to control bleeding. Bleeding from a tonsillectomy and adenoidectomy is minimal and postoperatively the risk of bleeding is approximately four percent, although this rarely life threatening. ° °After your tonsillectomy and adenoidectomy post-op care at home: °1. Our patients are able to go home the same day. You may be given prescriptions for pain medications, if indicated. °2. It is extremely important to  remember that fluid intake is of utmost importance after a tonsillectomy. The amount that you drink must be maintained in the postoperative period. A good indication of whether a child is getting enough fluid is whether his/her urine output is constant. As long as children are urinating or wetting their diaper every 6 - 8 hours this is usually enough fluid intake.   °3. Although rare, this is a risk of some bleeding in the first ten days after surgery. This usually occurs between day five and nine postoperatively. This risk of bleeding is approximately four percent. If you or your child should have any bleeding you should remain calm and notify our office or go directly to the emergency room at  [facility] where they will contact us. Our doctors are available seven days a week for notification. We recommend sitting up quietly in a chair, place an ice pack on the front of the neck and spitting out the blood gently until we are able to contact you. Adults should gargle gently with ice water and this may help stop the bleeding. If the bleeding does not stop after a short time, i.e. 10 to 15 minutes, or seems to be increasing again, please contact us or go to the hospital.   °4. It is common for the pain to be worse at 5 - 7 days postoperatively. This occurs because the “scab” is peeling off and the mucous membrane (skin of the throat) is growing back where the tonsils were.   °5. It is common for a low-grade fever, less than 102, during the first week   after a tonsillectomy and adenoidectomy. It is usually due to not drinking enough liquids, and we suggest your use liquid Tylenol (acetaminophen) or the pain medicine with Tylenol (acetaminophen) prescribed in order to keep your temperature below 102. Please follow the directions on the back of the bottle. °6. Recommendations for post-operative pain in children and adults: °a) For Children 12 and younger: Recommendations are for oral Tylenol  (acetaminophen) and oral Motrin (ibuprofen). Administer the Tylenol (acetaminophen) and Motrin as stated on bottle for patient's age/weight. Sometimes it may be necessary to alternate the Tylenol (acetaminophen) and Motrin for improved pain control. Motrin (ibuprofen) does last slightly longer so many patients benefit from being given this prior to bedtime. All children should avoid Aspirin products for 2 weeks following surgery. °b) For children over the age of 12: Tylenol (acetaminophen) is the preferred first choice for pain control. Depending on your child's size, sometimes they will be given a combination of Tylenol (acetaminophen) and hydrocodone medication or sometimes it will be recommended they take Motrin (ibuprofen) in addition to the Tylenol (acetaminophen). Narcotics should always be used with caution in children following surgery as they can suppress their breathing and switching to over the counter Tylenol (acetaminophen) and Motrin (ibuprofen) as soon as possible is recommended. All patients should avoid Aspirin products for 2 weeks following surgery. °c) Adults: Usually adults will require a narcotic pain medication following a tonsillectomy. This usually has either hydrocodone or oxycodone in it and can usually be taken every 4 to 6 hours as needed for moderate pain. If the medication does not have Tylenol (acetaminophen) in it, you may also supplement Tylenol (acetaminophen) as needed every 4 to 6 hours for breakthrough or mild pain. Adults should avoid Aspirin, Aleve, Motrin, and Ibuprofen products for 2 weeks following surgery as they can increase your risk of bleeding. °7. If you happen to look in the mirror or into your child's mouth you will see white/gray patches on the back of the throat. This is what a scab looks like in the mouth and is normal after having a tonsillectomy and adenoidectomy. They will disappear once the tonsil areas heal completely. However, it may cause a noticeable odor,  and this too will disappear with time.     °8. You or your child may experience ear pain after having a tonsillectomy and adenoidectomy.  This is called referred pain and comes from the throat, but it is felt in the ears.  Ear pain is quite common and expected. It will usually go away after ten days. There is usually nothing wrong with the ears, and it is primarily due to the healing area stimulating the nerve to the ear that runs along the side of the throat. Use either the prescribed pain medicine or Tylenol (acetaminophen) as needed.  °9. The throat tissues after a tonsillectomy are obviously sensitive. Smoking around children who have had a tonsillectomy significantly increases the risk of bleeding. DO NOT SMOKE! ° °What to Expect Each Day  °First Day at Home °1. Patients will be discharged home the same day.  °2. Drink at least four glasses of liquid a day. Clear, cool liquids are recommended. Fruit juices containing citric acid are not recommended because they tend to cause pain. Carbonated beverages are allowed if you pour them from glass to glass to remove the bubbles as these tend to cause discomfort. Avoid alcoholic beverages.  °3. Eat very soft foods such as soups, broth, jello, custard, pudding, ice cream, popsicles, applesauce, mashed potatoes,   and in general anything that you can crush between your tongue and the roof of your mouth. Try adding Carnation Instant Breakfast Mix into your food for extra calories. It is not uncommon to lose 5 to 10 pounds of fluid weight. The weight will be gained back quickly once you're feeling better and drinking more.  °4. Sleep with your head elevated on two pillows for about three days to help decrease the swelling.  °5. DO NOT SMOKE!  °Day Two  °1. Rest as much as possible. Use common sense in your activities.  °2. Continue drinking at least four glasses of liquid per day.  °3. Follow the soft diet.  °4. Use your pain medication as needed.  °Day Three  °1. Advance  your activity as you are able and continue to follow the previous day's suggestions.  °Days Four Through Six  °1. Advance your diet and begin to eat more solid foods such as chopped hamburger. °2. Advance your activities slowly. Children should be kept mostly around the house.  °3. Not uncommonly, there will be more pain at this time. It is temporary, usually lasting a day or two.  °Day Seven Through Ten  °1. Most individuals by this time are able to return to work or school unless otherwise instructed. Consider sending children back to school for a half day on the first day back.  °

## 2021-04-23 ENCOUNTER — Encounter: Payer: Self-pay | Admitting: Otolaryngology

## 2021-04-25 ENCOUNTER — Ambulatory Visit: Payer: Managed Care, Other (non HMO) | Admitting: Anesthesiology

## 2021-04-25 ENCOUNTER — Encounter
Admission: RE | Disposition: A | Discharge status: Home / Self Care | Payer: Self-pay | Source: Ambulatory Visit | Attending: Otolaryngology

## 2021-04-25 ENCOUNTER — Encounter: Payer: Self-pay | Admitting: Otolaryngology

## 2021-04-25 ENCOUNTER — Other Ambulatory Visit: Payer: Self-pay

## 2021-04-25 ENCOUNTER — Ambulatory Visit
Admission: RE | Admit: 2021-04-25 | Discharge: 2021-04-25 | Disposition: A | Discharge status: Home / Self Care | Payer: Managed Care, Other (non HMO) | Source: Ambulatory Visit | Attending: Otolaryngology | Admitting: Otolaryngology

## 2021-04-25 DIAGNOSIS — J358 Other chronic diseases of tonsils and adenoids: Secondary | ICD-10-CM | POA: Insufficient documentation

## 2021-04-25 DIAGNOSIS — J3501 Chronic tonsillitis: Secondary | ICD-10-CM | POA: Diagnosis not present

## 2021-04-25 HISTORY — DX: Dizziness and giddiness: R42

## 2021-04-25 HISTORY — DX: Gastro-esophageal reflux disease without esophagitis: K21.9

## 2021-04-25 HISTORY — PX: TONSILLECTOMY: SHX5217

## 2021-04-25 HISTORY — DX: Presence of dental prosthetic device (complete) (partial): Z97.2

## 2021-04-25 HISTORY — DX: Fibromyalgia: M79.7

## 2021-04-25 SURGERY — TONSILLECTOMY
Anesthesia: General | Site: Throat | Laterality: Bilateral

## 2021-04-25 MED ORDER — ONDANSETRON HCL 4 MG/2ML IJ SOLN
INTRAMUSCULAR | Status: DC | PRN
Start: 2021-04-25 — End: 2021-04-25
  Administered 2021-04-25: 4 mg via INTRAVENOUS

## 2021-04-25 MED ORDER — DEXAMETHASONE SODIUM PHOSPHATE 4 MG/ML IJ SOLN
INTRAMUSCULAR | Status: DC | PRN
  Administered 2021-04-25: 10 mg via INTRAVENOUS

## 2021-04-25 MED ORDER — SUCCINYLCHOLINE CHLORIDE 200 MG/10ML IV SOSY
PREFILLED_SYRINGE | INTRAVENOUS | Status: DC | PRN
  Administered 2021-04-25: 100 mg via INTRAVENOUS

## 2021-04-25 MED ORDER — LACTATED RINGERS IV SOLN: INTRAVENOUS | Status: DC

## 2021-04-25 MED ORDER — FENTANYL CITRATE PF 50 MCG/ML IJ SOSY
25.0000 ug | PREFILLED_SYRINGE | INTRAMUSCULAR | Status: DC | PRN
  Administered 2021-04-25: 50 ug via INTRAVENOUS

## 2021-04-25 MED ORDER — GLYCOPYRROLATE 0.2 MG/ML IJ SOLN
INTRAMUSCULAR | Status: DC | PRN
  Administered 2021-04-25: .1 mg via INTRAVENOUS

## 2021-04-25 MED ORDER — SILVER NITRATE-POT NITRATE 75-25 % EX MISC
CUTANEOUS | Status: DC | PRN
  Administered 2021-04-25: 2 via TOPICAL

## 2021-04-25 MED ORDER — LIDOCAINE HCL (CARDIAC) PF 100 MG/5ML IV SOSY
PREFILLED_SYRINGE | INTRAVENOUS | Status: DC | PRN
  Administered 2021-04-25: 50 mg via INTRAVENOUS

## 2021-04-25 MED ORDER — SCOPOLAMINE 1 MG/3DAYS TD PT72
1.0000 | MEDICATED_PATCH | Freq: Once | TRANSDERMAL | Status: DC
  Administered 2021-04-25: 1.5 mg via TRANSDERMAL

## 2021-04-25 MED ORDER — FENTANYL CITRATE (PF) 100 MCG/2ML IJ SOLN
INTRAMUSCULAR | Status: DC | PRN
  Administered 2021-04-25: 100 ug via INTRAVENOUS

## 2021-04-25 MED ORDER — OXYCODONE HCL 5 MG/5ML PO SOLN
5.0000 mg | Freq: Once | ORAL | Status: AC | PRN
  Administered 2021-04-25: 5 mg via ORAL

## 2021-04-25 MED ORDER — MIDAZOLAM HCL 5 MG/5ML IJ SOLN
INTRAMUSCULAR | Status: DC | PRN
  Administered 2021-04-25: 2 mg via INTRAVENOUS

## 2021-04-25 MED ORDER — OXYCODONE HCL 5 MG PO TABS: 5.0000 mg | ORAL_TABLET | Freq: Once | ORAL | Status: AC | PRN

## 2021-04-25 MED ORDER — ACETAMINOPHEN 10 MG/ML IV SOLN
1000.0000 mg | Freq: Once | INTRAVENOUS | Status: AC
  Administered 2021-04-25: 1000 mg via INTRAVENOUS

## 2021-04-25 MED ORDER — PROPOFOL 10 MG/ML IV BOLUS
INTRAVENOUS | Status: DC | PRN
  Administered 2021-04-25: 30 mg via INTRAVENOUS
  Administered 2021-04-25: 20 mg via INTRAVENOUS
  Administered 2021-04-25: 120 mg via INTRAVENOUS

## 2021-04-25 MED ORDER — HYDROCODONE-ACETAMINOPHEN 7.5-325 MG/15ML PO SOLN: 15.0000 mL | ORAL | 0 refills | Status: AC | PRN

## 2021-04-25 MED ORDER — PROMETHAZINE HCL 25 MG/ML IJ SOLN: 6.2500 mg | INTRAMUSCULAR | Status: DC | PRN

## 2021-04-25 SURGICAL SUPPLY — 13 items
BLADE BOVIE TIP EXT 4 (BLADE) ×2 IMPLANT
CANISTER SUCT 1200ML W/VALVE (MISCELLANEOUS) ×2 IMPLANT
ELECT REM PT RETURN 9FT ADLT (ELECTROSURGICAL) ×2
ELECTRODE REM PT RTRN 9FT ADLT (ELECTROSURGICAL) ×1 IMPLANT
GLOVE SURG GAMMEX PI TX LF 7.5 (GLOVE) ×2 IMPLANT
KIT TURNOVER KIT A (KITS) ×2 IMPLANT
PACK TONSIL AND ADENOID CUSTOM (PACKS) ×2 IMPLANT
PENCIL SMOKE EVACUATOR (MISCELLANEOUS) ×2 IMPLANT
SLEEVE SUCTION 125 (MISCELLANEOUS) ×2 IMPLANT
SOL ANTI-FOG 6CC FOG-OUT (MISCELLANEOUS) ×1 IMPLANT
SOL FOG-OUT ANTI-FOG 6CC (MISCELLANEOUS) ×1
SPONGE TONSIL 1 RF SGL (DISPOSABLE) ×1 IMPLANT
STRAP BODY AND KNEE 60X3 (MISCELLANEOUS) ×2 IMPLANT

## 2021-04-25 NOTE — H&P (Signed)
H&P has been reviewed and patient reevaluated, no changes necessary. To be downloaded later.  

## 2021-04-25 NOTE — Anesthesia Postprocedure Evaluation (Signed)
Anesthesia Post Note  Patient: Lovena Le  Procedure(s) Performed: TONSILLECTOMY (Bilateral: Throat)     Patient location during evaluation: PACU Anesthesia Type: General Level of consciousness: awake and alert Pain management: pain level controlled Vital Signs Assessment: post-procedure vital signs reviewed and stable Respiratory status: spontaneous breathing, nonlabored ventilation and respiratory function stable Cardiovascular status: blood pressure returned to baseline and stable Postop Assessment: no apparent nausea or vomiting Anesthetic complications: no   No notable events documented.  Loni Beckwith

## 2021-04-25 NOTE — Anesthesia Preprocedure Evaluation (Signed)
Anesthesia Evaluation  Patient identified by MRN, date of birth, ID band Patient awake    Reviewed: Allergy & Precautions, H&P , NPO status , Patient's Chart, lab work & pertinent test results, reviewed documented beta blocker date and time   Airway Mallampati: III  TM Distance: >3 FB Neck ROM: full    Dental no notable dental hx.    Pulmonary neg pulmonary ROS,    Pulmonary exam normal breath sounds clear to auscultation       Cardiovascular Exercise Tolerance: Good hypertension, negative cardio ROS   Rhythm:regular Rate:Normal     Neuro/Psych negative neurological ROS  negative psych ROS   GI/Hepatic negative GI ROS, Neg liver ROS, GERD  ,  Endo/Other  negative endocrine ROSdiabetes  Renal/GU negative Renal ROS  negative genitourinary   Musculoskeletal  (+) Arthritis , Fibromyalgia -  Abdominal   Peds  Hematology negative hematology ROS (+)   Anesthesia Other Findings   Reproductive/Obstetrics negative OB ROS                             Anesthesia Physical Anesthesia Plan  ASA: 2  Anesthesia Plan: General   Post-op Pain Management:    Induction:   PONV Risk Score and Plan: 3 and Scopolamine patch - Pre-op, Dexamethasone and Ondansetron  Airway Management Planned:   Additional Equipment:   Intra-op Plan:   Post-operative Plan:   Informed Consent: I have reviewed the patients History and Physical, chart, labs and discussed the procedure including the risks, benefits and alternatives for the proposed anesthesia with the patient or authorized representative who has indicated his/her understanding and acceptance.     Dental Advisory Given  Plan Discussed with: CRNA  Anesthesia Plan Comments:         Anesthesia Quick Evaluation

## 2021-04-25 NOTE — Anesthesia Procedure Notes (Signed)
Procedure Name: Intubation Date/Time: 04/25/2021 8:10 AM Performed by: Jimmy Picket, CRNA Pre-anesthesia Checklist: Patient identified, Emergency Drugs available, Suction available, Patient being monitored and Timeout performed Patient Re-evaluated:Patient Re-evaluated prior to induction Oxygen Delivery Method: Circle system utilized Preoxygenation: Pre-oxygenation with 100% oxygen Induction Type: IV induction Ventilation: Mask ventilation without difficulty Laryngoscope Size: Miller and 2 Grade View: Grade I Tube type: Oral Rae Tube size: 7.0 mm Number of attempts: 1 Placement Confirmation: ETT inserted through vocal cords under direct vision, positive ETCO2 and breath sounds checked- equal and bilateral Tube secured with: Tape Dental Injury: Teeth and Oropharynx as per pre-operative assessment

## 2021-04-25 NOTE — Transfer of Care (Signed)
Immediate Anesthesia Transfer of Care Note  Patient: Carolyn Holden  Procedure(s) Performed: TONSILLECTOMY (Bilateral: Throat)  Patient Location: PACU  Anesthesia Type: General  Level of Consciousness: awake, alert  and patient cooperative  Airway and Oxygen Therapy: Patient Spontanous Breathing and Patient connected to supplemental oxygen  Post-op Assessment: Post-op Vital signs reviewed, Patient's Cardiovascular Status Stable, Respiratory Function Stable, Patent Airway and No signs of Nausea or vomiting  Post-op Vital Signs: Reviewed and stable  Complications: No notable events documented.

## 2021-04-26 ENCOUNTER — Encounter: Payer: Self-pay | Admitting: Otolaryngology

## 2021-04-26 LAB — SURGICAL PATHOLOGY

## 2021-04-26 NOTE — Op Note (Signed)
04/26/2021  1:49 PM    Carolyn Holden, Carolyn Holden  [id number] The surgical procedure was done on 04/25/2021.  Pre-Op Dx: Chronic tonsillitis with tonsillar lithiasis  Post-op Dx: Same  Proc: Tonsillectomy  Surg:  Beverly Sessions Ronita Hargreaves  Anes:  GOT  EBL: 50 mL  Comp: None  Findings: Very large and cryptic tonsils.  Her left side was larger and 4+.  Her right side was 3+  Procedure: Patient was brought to the operating room and placed in a supine position.  She was given general anesthesia by oral endotracheal intubation.  Once the patient was asleep a Vernelle Emerald was used to visualize the oropharynx.  The tonsils were very large as stated above.  They were cryptic with debris in the crypts.  The uvula was small and soft palate was retracted.  The nasopharynx was completely clear with no significant adenoid tissue at all.  The left tonsil was grasped and pulled medially.  The anterior pillar was incised.  It was dissected from its fossa using blunt dissection and electrocautery to control bleeding as I went.  The tonsil was freed from the tongue base.  Bleeding was controlled with electrocautery.  There is a small wound still at the tongue base and silver nitrate cautery was placed here with a sponge and direct pressure to help coagulate this.  The right tonsil was visualized and grasped and pulled medially.  The anterior pillar was incised.  It was dissected from its fossa using blunt dissection and electrocautery to control bleeding as it was removed.  The tonsil was freed from the tongue base.  Bleeding was controlled with electrocautery.  There is a slight amount of ooze at the tongue base and silver nitrate cautery was placed here within a sponge for direct pressure.  This was allowed to sit for 5 to 10 minutes.  The sponge was removed from the opposite side and there was no further bleeding in the left tonsillar bed.  A soft suction catheter was placed down into the esophagus and stomach.  A small  amount of light yellow fluid was suctioned from the stomach.  The sponge was then removed from the right tongue base and there was no bleeding here.  The patient was awakened and taken to the recovery room in satisfactory condition.  There were no operative complications.  She tolerated this procedure very well  Dispo:   To PACU to be discharged home  Plan: To follow-up in the office in a couple of weeks.  She will push liquids to remain well-hydrated.  She is given some hydrocodone with Tylenol liquid that she can use for pain if needed.  Beverly Sessions Carolyn Holden  04/26/2021 1:49 PM

## 2021-10-19 IMAGING — RF DG ESOPHAGUS
9 of 11 series · 14 of 24 positions shown · non-contrast
Comparison: None.

CLINICAL DATA: Or pharyngeal dysphagia

EXAM:
ESOPHOGRAM/BARIUM SWALLOW
TECHNIQUE: Combined double contrast and single contrast examination performed
using effervescent crystals, thick barium liquid, and thin barium
liquid.
FLUOROSCOPY TIME:  Fluoroscopy Time:  36 seconds
Radiation Exposure Index (if provided by the fluoroscopic device):
5.9 mGy
Number of Acquired Spot Images: 0

[Series 1: cp_standard · 0.25mm/px · 2 of 13 frames shown (1 of 9)]
[frame 2/13]
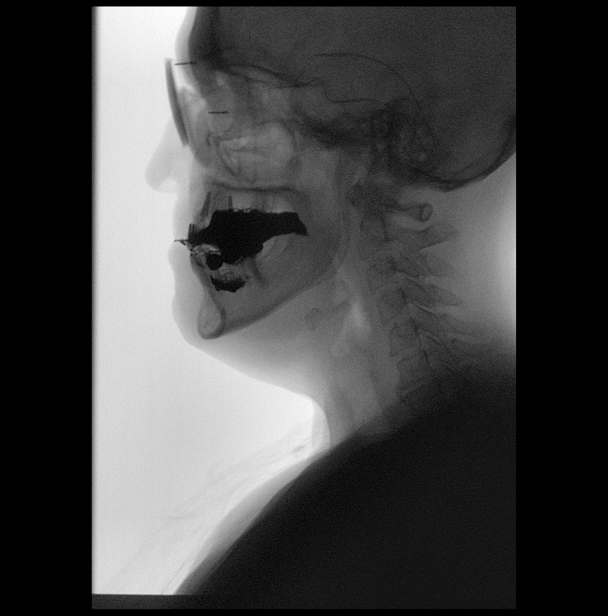
[frame 12/13]
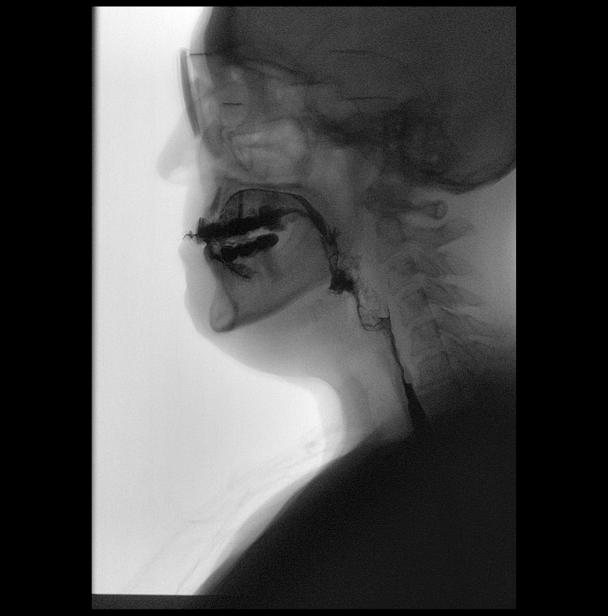

[Series 2: cp_standard · 0.25mm/px · 1 of 13 frames shown (2 of 9)]
[frame 12/13]
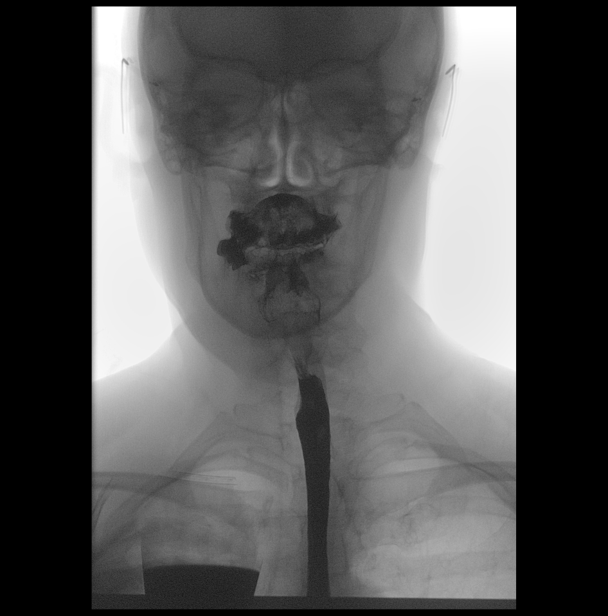

[Series 3: cp_standard · 0.25mm/px · 2 of 3 frames shown (3 of 9)]
[frame 1/3]
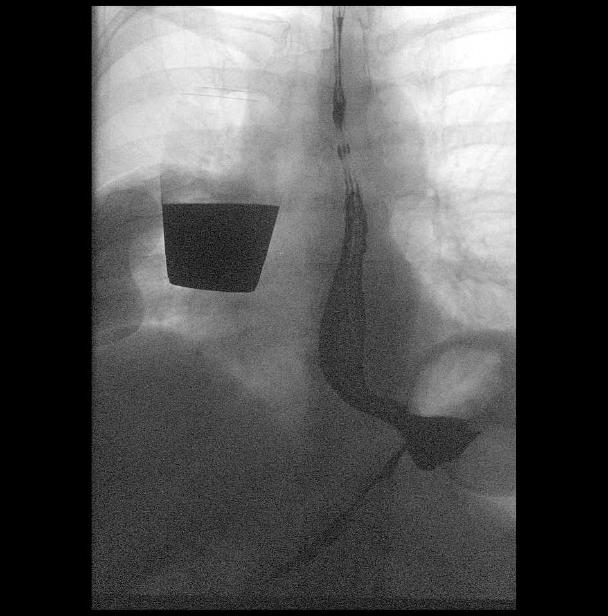
[frame 2/3]
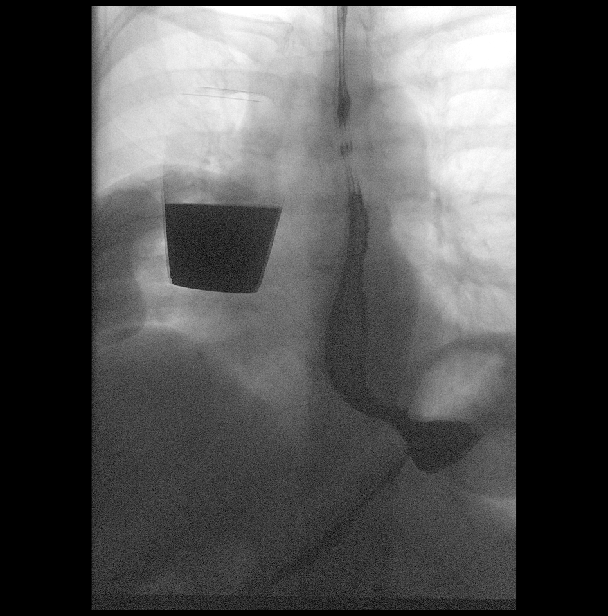

[Series 4: cp_standard · 0.25mm/px · 2 of 47 frames shown (4 of 9)]
[frame 8/47]
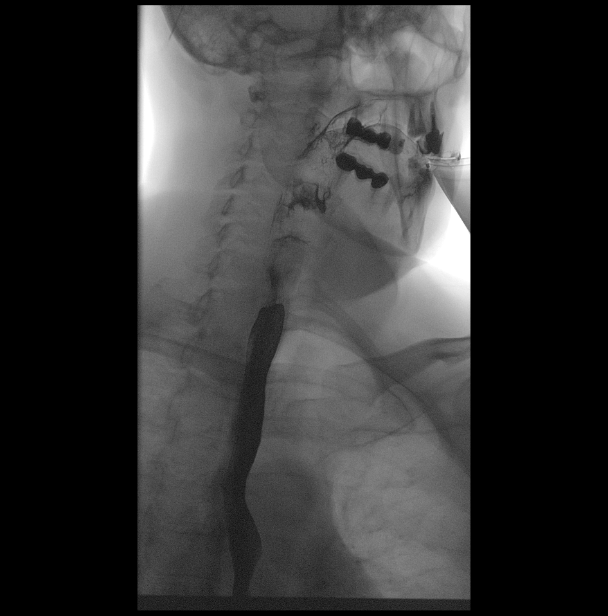
[frame 24/47]
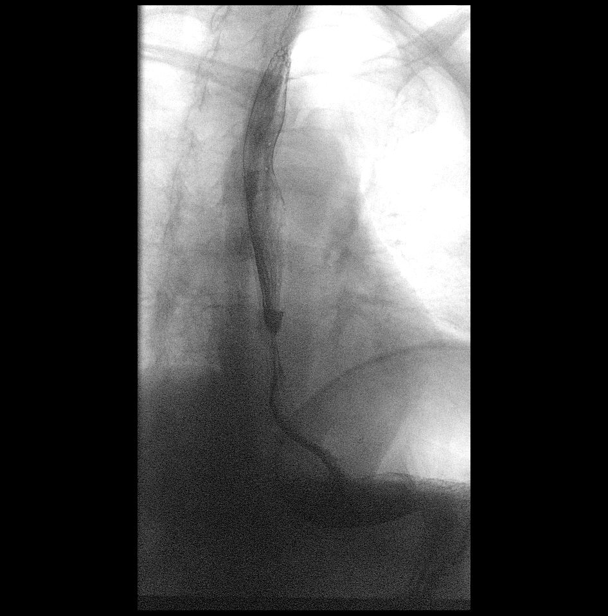

[Series 5: cp_standard · 0.25mm/px · 2 of 28 frames shown (5 of 9)]
[frame 3/28]
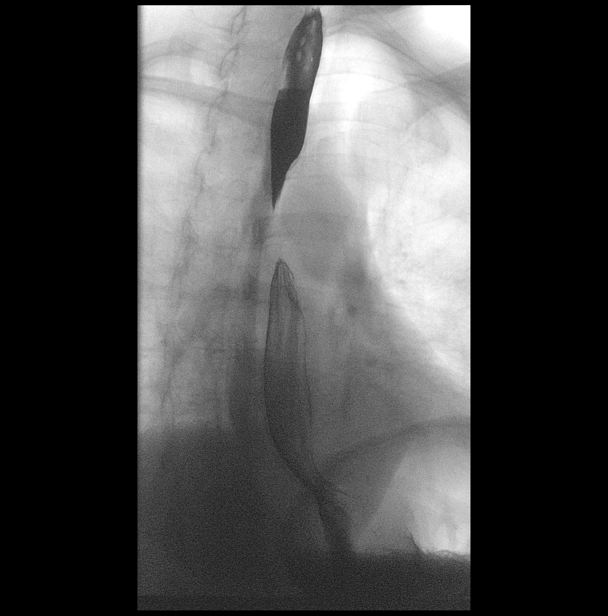
[frame 15/28]
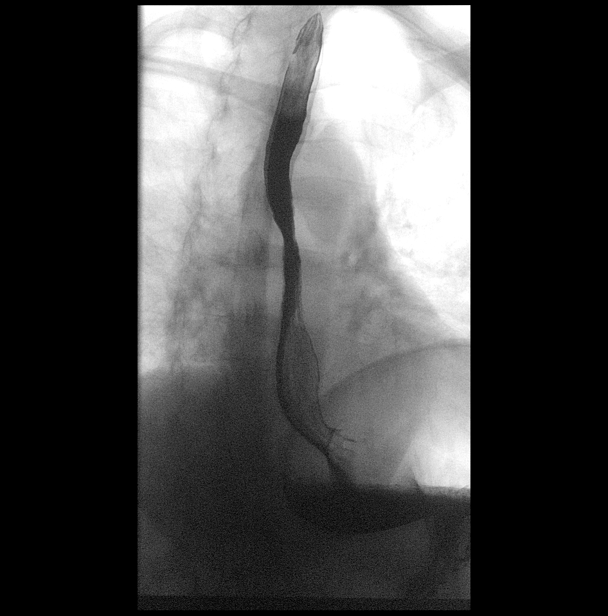

[Series 6: cp_standard · 0.27mm/px · 1 of 1 slices shown (6 of 9)]
[im 1/1]
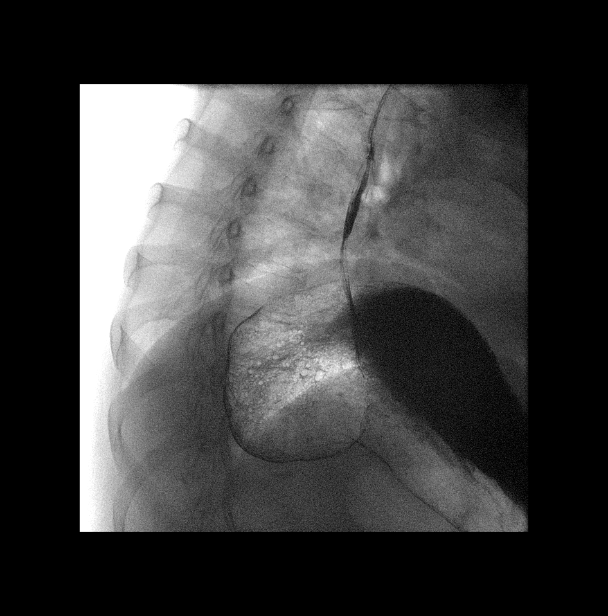

[Series 8: cp_standard · 0.27mm/px · 1 of 1 slices shown (7 of 9)]
[im 1/1]
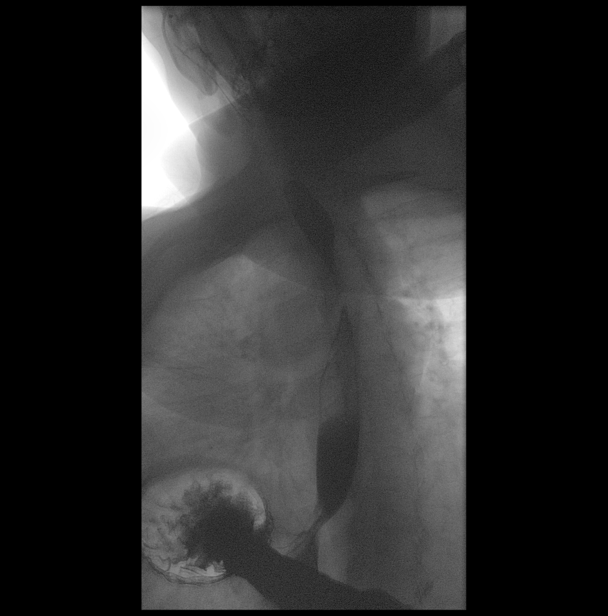

[Series 9: cp_standard · 0.27mm/px · 2 of 51 frames shown (8 of 9)]
[frame 8/51]
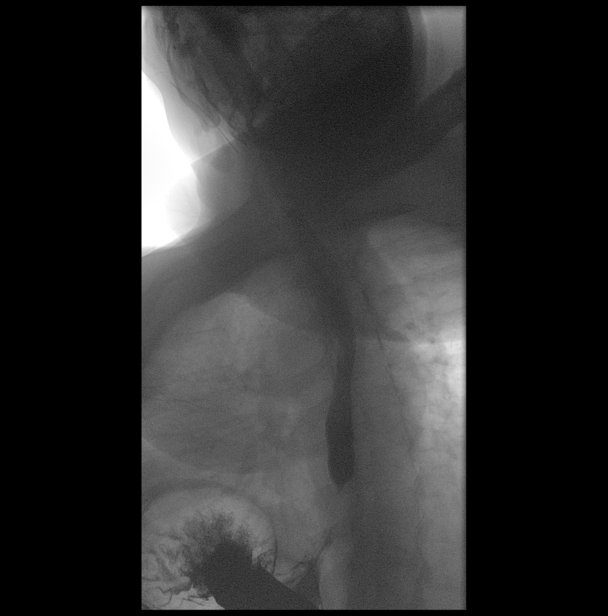
[frame 50/51]
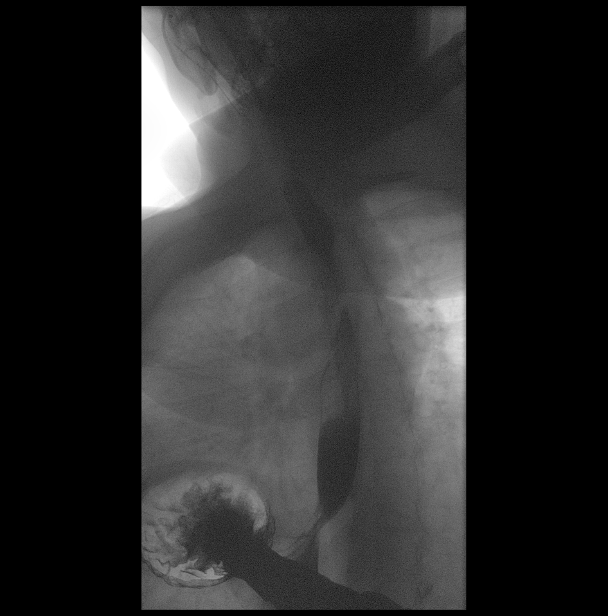

[Series 11: cp_standard · 0.27mm/px · 1 of 1 slices shown (9 of 9)]
[im 1/1]
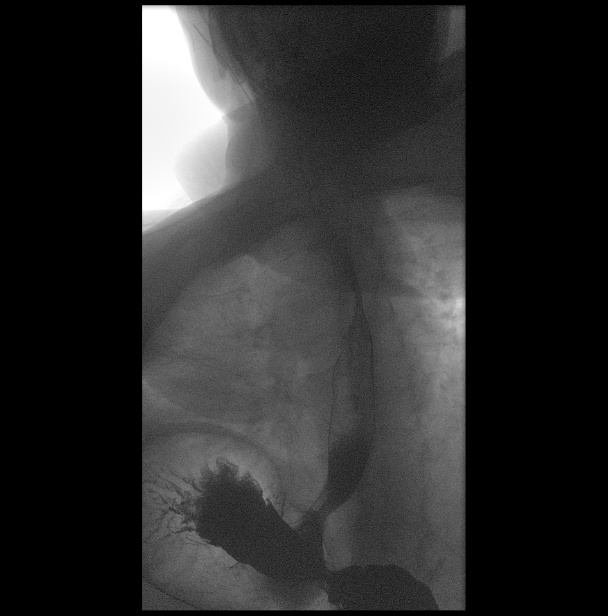

[14 of 24 positions shown; findings below may reference images not displayed]

FINDINGS: Normal pharyngeal anatomy and motility. Contrast flowed freely
through the esophagus without evidence of a stricture or mass.
Normal esophageal mucosa without evidence of irregularity or
ulceration. Esophageal motility was normal. Mild gastroesophageal
reflux. No definite hiatal hernia was demonstrated.

At the end of the examination a 13 mm barium tablet was administered
which transited through the esophagus and esophagogastric junction
without delay.
IMPRESSION: 1. Mild gastroesophageal reflux.
2. Otherwise normal barium swallow.
